# Patient Record
Sex: Male | Born: 1975 | Hispanic: No | Marital: Married | Smoking: Never smoker
Health system: Southern US, Community
[De-identification: ages and names within clinical notes are randomized; demographics above are authoritative.]

## PROBLEM LIST (undated history)

## (undated) DIAGNOSIS — M2170 Unequal limb length (acquired), unspecified site: Secondary | ICD-10-CM

## (undated) DIAGNOSIS — M25559 Pain in unspecified hip: Secondary | ICD-10-CM

## (undated) HISTORY — DX: Unequal limb length (acquired), unspecified site: M21.70

## (undated) HISTORY — DX: Pain in unspecified hip: M25.559

## (undated) HISTORY — PX: LEG SURGERY: SHX1003

---

## 1998-02-09 HISTORY — PX: HIP SURGERY: SHX245

## 2013-09-05 ENCOUNTER — Other Ambulatory Visit: Payer: Self-pay | Admitting: Orthopaedic Surgery

## 2013-09-05 DIAGNOSIS — M25551 Pain in right hip: Secondary | ICD-10-CM

## 2013-09-05 DIAGNOSIS — M25552 Pain in left hip: Secondary | ICD-10-CM

## 2013-09-11 ENCOUNTER — Ambulatory Visit
Admission: RE | Admit: 2013-09-11 | Discharge: 2013-09-11 | Disposition: A | Discharge status: Home / Self Care | Payer: PRIVATE HEALTH INSURANCE | Source: Ambulatory Visit | Attending: Orthopaedic Surgery | Admitting: Orthopaedic Surgery

## 2013-09-11 DIAGNOSIS — M25552 Pain in left hip: Secondary | ICD-10-CM

## 2013-10-05 ENCOUNTER — Telehealth: Payer: Self-pay

## 2013-10-31 DIAGNOSIS — Z9889 Other specified postprocedural states: Secondary | ICD-10-CM | POA: Insufficient documentation

## 2013-10-31 DIAGNOSIS — M25559 Pain in unspecified hip: Secondary | ICD-10-CM | POA: Insufficient documentation

## 2013-11-03 ENCOUNTER — Encounter: Payer: Self-pay | Admitting: Internal Medicine

## 2013-11-03 ENCOUNTER — Ambulatory Visit (INDEPENDENT_AMBULATORY_CARE_PROVIDER_SITE_OTHER): Payer: PRIVATE HEALTH INSURANCE | Admitting: Internal Medicine

## 2013-11-03 VITALS — BP 122/68 | HR 63 | Temp 98.6°F | Ht 63.0 in | Wt 137.1 lb

## 2013-11-03 DIAGNOSIS — Z0000 Encounter for general adult medical examination without abnormal findings: Secondary | ICD-10-CM | POA: Insufficient documentation

## 2013-11-03 DIAGNOSIS — Z23 Encounter for immunization: Secondary | ICD-10-CM

## 2013-11-03 DIAGNOSIS — M25552 Pain in left hip: Secondary | ICD-10-CM

## 2013-11-03 DIAGNOSIS — M25559 Pain in unspecified hip: Secondary | ICD-10-CM | POA: Insufficient documentation

## 2013-11-03 NOTE — Progress Notes (Signed)
Pre visit review using our clinic review tool, if applicable. No additional management support is needed unless otherwise documented below in the visit note. 

## 2013-11-03 NOTE — Patient Instructions (Addendum)
  Stop by the front desk and schedule labs Stage be done within few days (fasting) Orders  are already in the computer    Please come back Woodrow the office in 1 year  for a physical exam. Come back fasting      Get your flu shot next week   Testicular Self-Exam A self-examination of your testicles involves looking at and feeling your testicles for abnormal lumps or swelling. Several things can cause swelling, lumps, or pain in your testicles. Some of these causes are:  Injuries.  Inflammation.  Infection.  Accumulation of fluids around your testicle (hydrocele).  Twisted testicles (testicular torsion).  Testicular cancer. Self-examination of the testicles and groin areas may be advised if you are at risk for testicular cancer. Risks for testicular cancer include:  An undescended testicle (cryptorchidism).  A history of previous testicular cancer.  A family history of testicular cancer. The testicles are easiest Capozzoli examine after warm baths or showers and are more difficult Lawry examine when you are cold. This is because the muscles attached Rochin the testicles retract and pull them up higher or into the abdomen. Follow these steps while you are standing:  Hold your penis away from your body.  Roll one testicle between your thumb and forefinger, feeling the entire testicle.  Roll the other testicle between your thumb and forefinger, feeling the entire testicle. Feel for lumps, swelling, or discomfort. A normal testicle is egg shaped and feels firm. It is smooth and not tender. The spermatic cord can be felt as a firm spaghetti-like cord at the back of your testicle. It is also important Azzarello examine the crease between the front of your leg and your abdomen. Feel for any bumps that are tender. These could be enlarged lymph nodes.  Document Released: 05/04/2000 Document Revised: 09/28/2012 Document Reviewed: 07/18/2012 [facility] Patient Information 2015 [place], [place]. This information is not  intended Gorgas replace advice given Robinette you by your health care provider. Make sure you discuss any questions you have with your health care provider.

## 2013-11-03 NOTE — Assessment & Plan Note (Addendum)
Td-- today rec a Flu shot next week Atypical chest pain, EKG with early repolarization changes. Advice Buick call me if the pain change or increases Diet-exercise discussed Labs including FLP, CBC, CMP, TSH, and screening for hepatitis B, hepatitis C STE
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
## 2013-11-08 ENCOUNTER — Other Ambulatory Visit (INDEPENDENT_AMBULATORY_CARE_PROVIDER_SITE_OTHER): Payer: PRIVATE HEALTH INSURANCE

## 2013-11-08 DIAGNOSIS — Z0000 Encounter for general adult medical examination without abnormal findings: Secondary | ICD-10-CM

## 2013-11-08 LAB — TSH: TSH: 0.65 u[IU]/mL (ref 0.35–4.50)

## 2013-11-08 LAB — CBC WITH DIFFERENTIAL/PLATELET
Basophils Absolute: 0 10*3/uL (ref 0.0–0.1)
Basophils Relative: 0.5 % (ref 0.0–3.0)
EOS ABS: 0.4 10*3/uL (ref 0.0–0.7)
EOS PCT: 7.5 % — AB (ref 0.0–5.0)
HCT: 43.4 % (ref 39.0–52.0)
HEMOGLOBIN: 14.4 g/dL (ref 13.0–17.0)
Lymphocytes Relative: 36.1 % (ref 12.0–46.0)
Lymphs Abs: 1.8 10*3/uL (ref 0.7–4.0)
MCHC: 33.3 g/dL (ref 30.0–36.0)
MCV: 91.9 fl (ref 78.0–100.0)
Monocytes Absolute: 0.3 10*3/uL (ref 0.1–1.0)
Monocytes Relative: 6 % (ref 3.0–12.0)
NEUTROS ABS: 2.4 10*3/uL (ref 1.4–7.7)
Neutrophils Relative %: 49.9 % (ref 43.0–77.0)
Platelets: 273 10*3/uL (ref 150.0–400.0)
RBC: 4.72 Mil/uL (ref 4.22–5.81)
RDW: 13.4 % (ref 11.5–15.5)
WBC: 4.9 10*3/uL (ref 4.0–10.5)

## 2013-11-08 LAB — LIPID PANEL
CHOL/HDL RATIO: 3
Cholesterol: 185 mg/dL (ref 0–200)
HDL: 52.9 mg/dL (ref >39.00)
LDL Cholesterol: 122 mg/dL — ABNORMAL HIGH (ref 0–99)
NonHDL: 132.1
TRIGLYCERIDES: 53 mg/dL (ref 0.0–149.0)
VLDL: 10.6 mg/dL (ref 0.0–40.0)

## 2013-11-08 LAB — COMPREHENSIVE METABOLIC PANEL
ALK PHOS: 56 U/L (ref 39–117)
ALT: 21 U/L (ref 0–53)
AST: 22 U/L (ref 0–37)
Albumin: 4.3 g/dL (ref 3.5–5.2)
BUN: 17 mg/dL (ref 6–23)
CO2: 28 mEq/L (ref 19–32)
CREATININE: 0.9 mg/dL (ref 0.4–1.5)
Calcium: 9.2 mg/dL (ref 8.4–10.5)
Chloride: 104 mEq/L (ref 96–112)
GFR: 97.48 mL/min (ref >60.00)
Glucose, Bld: 83 mg/dL (ref 70–99)
Potassium: 3.8 mEq/L (ref 3.5–5.1)
Sodium: 138 mEq/L (ref 135–145)
Total Bilirubin: 0.6 mg/dL (ref 0.2–1.2)
Total Protein: 7.2 g/dL (ref 6.0–8.3)

## 2013-11-09 LAB — HEPATITIS B SURFACE ANTIGEN: HEP B S AG: NEGATIVE

## 2013-11-09 LAB — HEPATITIS C ANTIBODY: HCV Ab: NEGATIVE

## 2013-11-09 LAB — HEPATITIS B CORE ANTIBODY, TOTAL: Hep B Core Total Ab: NONREACTIVE

## 2013-11-22 ENCOUNTER — Telehealth: Payer: Self-pay | Admitting: Internal Medicine

## 2013-11-22 NOTE — Telephone Encounter (Signed)
Caller name: Roger ShelterDong Relation Southard pt: Call back number: [phone]  Reason for call: Pt ask Jurewicz be called for results for his labs, why blood cell counts are high and Sandall see if needed an appt.

## 2013-11-22 NOTE — Telephone Encounter (Signed)
White cells are essentially normal, eosinophils are slightly elevated which is not uncommon this time of year due Payeur allergies, no further testing is recommended at this time

## 2013-11-22 NOTE — Telephone Encounter (Signed)
Spoke with Pt about white blood cells elevated informed him of Dr. Maurilio LovelyPazs recommendations.

## 2013-11-22 NOTE — Telephone Encounter (Signed)
Please advise 

## 2014-11-05 ENCOUNTER — Encounter: Payer: Self-pay | Admitting: *Deleted

## 2014-11-05 ENCOUNTER — Telehealth: Payer: Self-pay | Admitting: *Deleted

## 2014-11-05 NOTE — Telephone Encounter (Signed)
Pre-Visit Call completed with patient and chart updated.   Pre-Visit Info documented in Specialty Comments under SnapShot.    Patient cancelled appointment.

## 2014-11-06 ENCOUNTER — Encounter: Payer: PRIVATE HEALTH INSURANCE | Admitting: Internal Medicine

## 2015-10-24 ENCOUNTER — Ambulatory Visit: Payer: PRIVATE HEALTH INSURANCE | Admitting: Internal Medicine

## 2015-12-03 ENCOUNTER — Ambulatory Visit (INDEPENDENT_AMBULATORY_CARE_PROVIDER_SITE_OTHER): Payer: 59 | Admitting: Internal Medicine

## 2015-12-03 ENCOUNTER — Encounter: Payer: Self-pay | Admitting: Internal Medicine

## 2015-12-03 VITALS — BP 118/68 | HR 54 | Temp 97.8°F | Resp 14 | Ht 63.0 in | Wt 141.0 lb

## 2015-12-03 DIAGNOSIS — M2550 Pain in unspecified joint: Secondary | ICD-10-CM

## 2015-12-03 DIAGNOSIS — Z0000 Encounter for general adult medical examination without abnormal findings: Secondary | ICD-10-CM

## 2015-12-03 NOTE — Patient Instructions (Signed)
GO Noah Hodges THE LAB : Get the blood work     GO Noah Hodges THE FRONT DESK Schedule your next appointment   for a physical exam in 1 year 

## 2015-12-03 NOTE — Progress Notes (Signed)
Pre visit review using our clinic review tool, if applicable. No additional management support is needed unless otherwise documented below in the visit note. 
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
## 2015-12-03 NOTE — Assessment & Plan Note (Addendum)
Td--2015, had a  Flu shot last week  Diet-exercise discussed Labs : FLP, CBC, CMP, TSH, sedimentation rate, RF, ANA. HIV.

## 2015-12-04 DIAGNOSIS — Z09 Encounter for follow-up examination after completed treatment for conditions other than malignant neoplasm: Secondary | ICD-10-CM | POA: Insufficient documentation

## 2015-12-04 LAB — CBC WITH DIFFERENTIAL/PLATELET
BASOS ABS: 0 10*3/uL (ref 0.0–0.1)
Basophils Relative: 0.4 % (ref 0.0–3.0)
EOS PCT: 3.8 % (ref 0.0–5.0)
Eosinophils Absolute: 0.3 10*3/uL (ref 0.0–0.7)
HCT: 45.1 % (ref 39.0–52.0)
HEMOGLOBIN: 15.2 g/dL (ref 13.0–17.0)
Lymphocytes Relative: 26 % (ref 12.0–46.0)
Lymphs Abs: 1.9 10*3/uL (ref 0.7–4.0)
MCHC: 33.8 g/dL (ref 30.0–36.0)
MCV: 92.4 fl (ref 78.0–100.0)
MONO ABS: 0.4 10*3/uL (ref 0.1–1.0)
MONOS PCT: 5.9 % (ref 3.0–12.0)
NEUTROS PCT: 63.9 % (ref 43.0–77.0)
Neutro Abs: 4.7 10*3/uL (ref 1.4–7.7)
Platelets: 295 10*3/uL (ref 150.0–400.0)
RBC: 4.88 Mil/uL (ref 4.22–5.81)
RDW: 13 % (ref 11.5–15.5)
WBC: 7.4 10*3/uL (ref 4.0–10.5)

## 2015-12-04 LAB — COMPREHENSIVE METABOLIC PANEL
ALK PHOS: 67 U/L (ref 39–117)
ALT: 23 U/L (ref 0–53)
AST: 24 U/L (ref 0–37)
Albumin: 4.8 g/dL (ref 3.5–5.2)
BILIRUBIN TOTAL: 0.3 mg/dL (ref 0.2–1.2)
BUN: 15 mg/dL (ref 6–23)
CO2: 27 mEq/L (ref 19–32)
Calcium: 10 mg/dL (ref 8.4–10.5)
Chloride: 104 mEq/L (ref 96–112)
Creatinine, Ser: 0.82 mg/dL (ref 0.40–1.50)
GFR: 111.47 mL/min (ref >60.00)
Glucose, Bld: 91 mg/dL (ref 70–99)
Potassium: 4.1 mEq/L (ref 3.5–5.1)
Sodium: 140 mEq/L (ref 135–145)
TOTAL PROTEIN: 7.6 g/dL (ref 6.0–8.3)

## 2015-12-04 LAB — HIV ANTIBODY (ROUTINE TESTING W REFLEX): HIV: NONREACTIVE

## 2015-12-04 LAB — LIPID PANEL
Cholesterol: 203 mg/dL — ABNORMAL HIGH (ref 0–200)
HDL: 64.3 mg/dL (ref >39.00)
LDL Cholesterol: 122 mg/dL — ABNORMAL HIGH (ref 0–99)
NONHDL: 138.78
Total CHOL/HDL Ratio: 3
Triglycerides: 86 mg/dL (ref 0.0–149.0)
VLDL: 17.2 mg/dL (ref 0.0–40.0)

## 2015-12-04 LAB — RHEUMATOID FACTOR

## 2015-12-04 LAB — TSH: TSH: 1.28 u[IU]/mL (ref 0.35–4.50)

## 2015-12-04 LAB — ANA: ANA: NEGATIVE

## 2015-12-04 LAB — SEDIMENTATION RATE: Sed Rate: 7 mm/hr (ref 0–15)

## 2015-12-04 NOTE — Assessment & Plan Note (Signed)
Leg length discrepancy: Offered a referral [facility], due Bacchi the discrepancy he may develop early lower extremity arthritis, likely will benefit from a shoe insert. Declined referral  Hand arthralgias: No synovitis on exam, early OA? Will get a sedimentation rate, ANA and rheumatoid factor Tongue  irritation? Exam is completely normal, recommend Ramberg see a dentist or call if symptoms persist RTC one year

## 2017-01-26 ENCOUNTER — Encounter: Payer: Self-pay | Admitting: Internal Medicine

## 2017-01-26 ENCOUNTER — Ambulatory Visit (INDEPENDENT_AMBULATORY_CARE_PROVIDER_SITE_OTHER): Payer: 59 | Admitting: Internal Medicine

## 2017-01-26 VITALS — BP 124/68 | HR 58 | Temp 98.3°F | Resp 14 | Ht 63.0 in | Wt 136.2 lb

## 2017-01-26 DIAGNOSIS — Z0000 Encounter for general adult medical examination without abnormal findings: Secondary | ICD-10-CM

## 2017-01-26 NOTE — Progress Notes (Signed)
Pre visit review using our clinic review tool, if applicable. No additional management support is needed unless otherwise documented below in the visit note. 

## 2017-01-26 NOTE — Assessment & Plan Note (Addendum)
.-  Td 2015, had a  Flu shot  -Previous labs are reviewed, all very good, recheck a FLP. -Diet-exercise discussed
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
## 2017-01-26 NOTE — Patient Instructions (Signed)
GO Piper THE LAB : Get the blood work     GO Watanabe THE FRONT DESK Schedule your next appointment   for a physical exam in 1 year 

## 2017-01-27 LAB — LIPID PANEL
CHOLESTEROL: 171 mg/dL (ref 0–200)
HDL: 58.1 mg/dL (ref >39.00)
LDL Cholesterol: 99 mg/dL (ref 0–99)
NONHDL: 113
TRIGLYCERIDES: 68 mg/dL (ref 0.0–149.0)
Total CHOL/HDL Ratio: 3
VLDL: 13.6 mg/dL (ref 0.0–40.0)

## 2017-01-27 NOTE — Assessment & Plan Note (Signed)
Here for CPX.  Doing well Complaint of "headache".  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  He thinks is stress/tension related.  Recommend good neck stretching before going Schoenberg work, good ergonomic posture, call if not improving.  Okay Prater take occasional Tylenol or ibuprofen.  Also encourage good stress management, yoga? Meditation?  Grace call if he has severe or unusual symptoms. RTC 1 year

## 2017-07-06 ENCOUNTER — Encounter: Payer: Self-pay | Admitting: Internal Medicine

## 2017-07-06 ENCOUNTER — Ambulatory Visit (INDEPENDENT_AMBULATORY_CARE_PROVIDER_SITE_OTHER): Payer: 59 | Admitting: Internal Medicine

## 2017-07-06 VITALS — BP 116/74 | HR 63 | Temp 98.3°F | Resp 14 | Ht 63.0 in | Wt 143.4 lb

## 2017-07-06 DIAGNOSIS — Z0000 Encounter for general adult medical examination without abnormal findings: Secondary | ICD-10-CM | POA: Diagnosis not present

## 2017-07-06 NOTE — Assessment & Plan Note (Addendum)
.-  Td 2015 -Labs: CMP, CBC, TSH -Diet-exercise discussed
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
## 2017-07-06 NOTE — Patient Instructions (Signed)
GO Cerrato THE LAB : Get the blood work     GO Tailor THE FRONT DESK Schedule your next appointment   for a physical exam in 1 year 

## 2017-07-06 NOTE — Progress Notes (Signed)
Pre visit review using our clinic review tool, if applicable. No additional management support is needed unless otherwise documented below in the visit note. 

## 2017-07-07 LAB — COMPREHENSIVE METABOLIC PANEL
ALBUMIN: 4.4 g/dL (ref 3.5–5.2)
ALK PHOS: 53 U/L (ref 39–117)
ALT: 12 U/L (ref 0–53)
AST: 15 U/L (ref 0–37)
BILIRUBIN TOTAL: 0.4 mg/dL (ref 0.2–1.2)
BUN: 15 mg/dL (ref 6–23)
CO2: 27 meq/L (ref 19–32)
CREATININE: 1.01 mg/dL (ref 0.40–1.50)
Calcium: 9.3 mg/dL (ref 8.4–10.5)
Chloride: 104 mEq/L (ref 96–112)
GFR: 86.92 mL/min (ref >60.00)
Glucose, Bld: 104 mg/dL — ABNORMAL HIGH (ref 70–99)
Potassium: 4.3 mEq/L (ref 3.5–5.1)
Sodium: 139 mEq/L (ref 135–145)
TOTAL PROTEIN: 6.9 g/dL (ref 6.0–8.3)

## 2017-07-07 LAB — CBC WITH DIFFERENTIAL/PLATELET
BASOS ABS: 0.1 10*3/uL (ref 0.0–0.1)
Basophils Relative: 1 % (ref 0.0–3.0)
EOS ABS: 0.3 10*3/uL (ref 0.0–0.7)
Eosinophils Relative: 5.6 % — ABNORMAL HIGH (ref 0.0–5.0)
HCT: 43 % (ref 39.0–52.0)
Hemoglobin: 14.2 g/dL (ref 13.0–17.0)
LYMPHS PCT: 29.7 % (ref 12.0–46.0)
Lymphs Abs: 1.7 10*3/uL (ref 0.7–4.0)
MCHC: 33 g/dL (ref 30.0–36.0)
MCV: 95.4 fl (ref 78.0–100.0)
Monocytes Absolute: 0.5 10*3/uL (ref 0.1–1.0)
Monocytes Relative: 8.1 % (ref 3.0–12.0)
NEUTROS ABS: 3.2 10*3/uL (ref 1.4–7.7)
NEUTROS PCT: 55.6 % (ref 43.0–77.0)
PLATELETS: 283 10*3/uL (ref 150.0–400.0)
RBC: 4.51 Mil/uL (ref 4.22–5.81)
RDW: 12.8 % (ref 11.5–15.5)
WBC: 5.7 10*3/uL (ref 4.0–10.5)

## 2017-07-07 LAB — TSH: TSH: 0.98 u[IU]/mL (ref 0.35–4.50)

## 2017-07-07 NOTE — Assessment & Plan Note (Signed)
Here for CPX.  Doing well HA: See last visit, resolved Leg length discrepancy: He is having some issues with decrease flexibility of the hip, not interested in further intervention but at some point he might benefit from seeing physical and rehabilitation medicine. RTC 1 year

## 2018-09-14 ENCOUNTER — Encounter: Payer: Self-pay | Admitting: Internal Medicine

## 2019-06-15 ENCOUNTER — Ambulatory Visit (HOSPITAL_BASED_OUTPATIENT_CLINIC_OR_DEPARTMENT_OTHER)
Admission: RE | Admit: 2019-06-15 | Discharge: 2019-06-15 | Disposition: A | Discharge status: Home / Self Care | Payer: BC Managed Care – PPO | Source: Ambulatory Visit | Attending: Internal Medicine | Admitting: Internal Medicine

## 2019-06-15 ENCOUNTER — Ambulatory Visit (INDEPENDENT_AMBULATORY_CARE_PROVIDER_SITE_OTHER): Payer: BC Managed Care – PPO | Admitting: Internal Medicine

## 2019-06-15 ENCOUNTER — Encounter: Payer: Self-pay | Admitting: Internal Medicine

## 2019-06-15 ENCOUNTER — Other Ambulatory Visit: Payer: Self-pay

## 2019-06-15 VITALS — BP 127/78 | HR 62 | Temp 97.5°F | Resp 16 | Ht 63.0 in | Wt 139.0 lb

## 2019-06-15 DIAGNOSIS — Z0000 Encounter for general adult medical examination without abnormal findings: Secondary | ICD-10-CM

## 2019-06-15 DIAGNOSIS — R079 Chest pain, unspecified: Secondary | ICD-10-CM | POA: Diagnosis not present

## 2019-06-15 LAB — LIPID PANEL
Cholesterol: 172 mg/dL (ref 0–200)
HDL: 53.7 mg/dL (ref >39.00)
LDL Cholesterol: 99 mg/dL (ref 0–99)
NonHDL: 118.49
Total CHOL/HDL Ratio: 3
Triglycerides: 95 mg/dL (ref 0.0–149.0)
VLDL: 19 mg/dL (ref 0.0–40.0)

## 2019-06-15 LAB — COMPREHENSIVE METABOLIC PANEL
ALT: 12 U/L (ref 0–53)
AST: 17 U/L (ref 0–37)
Albumin: 4.4 g/dL (ref 3.5–5.2)
Alkaline Phosphatase: 61 U/L (ref 39–117)
BUN: 17 mg/dL (ref 6–23)
CO2: 29 mEq/L (ref 19–32)
Calcium: 9 mg/dL (ref 8.4–10.5)
Chloride: 104 mEq/L (ref 96–112)
Creatinine, Ser: 0.79 mg/dL (ref 0.40–1.50)
GFR: 107.55 mL/min (ref >60.00)
Glucose, Bld: 85 mg/dL (ref 70–99)
Potassium: 4.1 mEq/L (ref 3.5–5.1)
Sodium: 137 mEq/L (ref 135–145)
Total Bilirubin: 0.4 mg/dL (ref 0.2–1.2)
Total Protein: 6.8 g/dL (ref 6.0–8.3)

## 2019-06-15 LAB — CBC WITH DIFFERENTIAL/PLATELET
Basophils Absolute: 0.1 10*3/uL (ref 0.0–0.1)
Basophils Relative: 1.1 % (ref 0.0–3.0)
Eosinophils Absolute: 0.4 10*3/uL (ref 0.0–0.7)
Eosinophils Relative: 6.9 % — ABNORMAL HIGH (ref 0.0–5.0)
HCT: 44.3 % (ref 39.0–52.0)
Hemoglobin: 14.8 g/dL (ref 13.0–17.0)
Lymphocytes Relative: 33.1 % (ref 12.0–46.0)
Lymphs Abs: 1.7 10*3/uL (ref 0.7–4.0)
MCHC: 33.4 g/dL (ref 30.0–36.0)
MCV: 96.7 fl (ref 78.0–100.0)
Monocytes Absolute: 0.5 10*3/uL (ref 0.1–1.0)
Monocytes Relative: 8.7 % (ref 3.0–12.0)
Neutro Abs: 2.6 10*3/uL (ref 1.4–7.7)
Neutrophils Relative %: 50.2 % (ref 43.0–77.0)
Platelets: 266 10*3/uL (ref 150.0–400.0)
RBC: 4.57 Mil/uL (ref 4.22–5.81)
RDW: 13.3 % (ref 11.5–15.5)
WBC: 5.2 10*3/uL (ref 4.0–10.5)

## 2019-06-15 LAB — TSH: TSH: 1.43 u[IU]/mL (ref 0.35–4.50)

## 2019-06-15 NOTE — Assessment & Plan Note (Addendum)
-  Td 2018 -Labs: CMP, CBC, FLP, TSH -Diet-exercise discussed, he is doing well. -Encouraged Kutsch get the Covid vaccination.

## 2019-06-15 NOTE — Progress Notes (Signed)
Pre visit review using our clinic review tool, if applicable. No additional management support is needed unless otherwise documented below in the visit note. 

## 2019-06-15 NOTE — Patient Instructions (Addendum)
COVID-19 Vaccine Information can be found at: [email or web address] For questions related [place] vaccine distribution or appointments, please email [email or web address] or call [phone].      GO Seabury THE LAB : Get the blood work     GO Gosse THE FRONT DESK, PLEASE SCHEDULE YOUR APPOINTMENTS Come back for a physical exam in 1 year  STOP BY THE FIRST FLOOR:  get the XR
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
## 2019-06-16 NOTE — Assessment & Plan Note (Signed)
Here for CPX Chest pain: As described above, quite atypical, unlikely Sammon be CAD related, for completeness we will get a chest x-ray and recommend observation. Dizziness: Also, at the end of the visit he mentioned that sometimes when he move his head fast get dizzy for a few seconds.  Rec  observation RTC 1 year

## 2020-05-07 ENCOUNTER — Encounter: Payer: Self-pay | Admitting: Internal Medicine

## 2020-06-19 ENCOUNTER — Encounter: Payer: BC Managed Care – PPO | Admitting: Internal Medicine

## 2020-10-03 ENCOUNTER — Encounter: Payer: Self-pay | Admitting: Internal Medicine

## 2020-10-03 ENCOUNTER — Ambulatory Visit (INDEPENDENT_AMBULATORY_CARE_PROVIDER_SITE_OTHER): Payer: BC Managed Care – PPO | Admitting: Internal Medicine

## 2020-10-03 ENCOUNTER — Other Ambulatory Visit: Payer: Self-pay

## 2020-10-03 ENCOUNTER — Ambulatory Visit (INDEPENDENT_AMBULATORY_CARE_PROVIDER_SITE_OTHER): Payer: BC Managed Care – PPO

## 2020-10-03 VITALS — BP 112/72 | HR 64 | Temp 98.2°F | Ht 63.0 in | Wt 138.0 lb

## 2020-10-03 DIAGNOSIS — R0789 Other chest pain: Secondary | ICD-10-CM | POA: Diagnosis not present

## 2020-10-03 DIAGNOSIS — Z0000 Encounter for general adult medical examination without abnormal findings: Secondary | ICD-10-CM | POA: Diagnosis not present

## 2020-10-03 LAB — CBC WITH DIFFERENTIAL/PLATELET
Basophils Absolute: 0 10*3/uL (ref 0.0–0.1)
Basophils Relative: 0.8 % (ref 0.0–3.0)
Eosinophils Absolute: 0.2 10*3/uL (ref 0.0–0.7)
Eosinophils Relative: 4 % (ref 0.0–5.0)
HCT: 43.1 % (ref 39.0–52.0)
Hemoglobin: 14.4 g/dL (ref 13.0–17.0)
Lymphocytes Relative: 33.9 % (ref 12.0–46.0)
Lymphs Abs: 1.9 10*3/uL (ref 0.7–4.0)
MCHC: 33.4 g/dL (ref 30.0–36.0)
MCV: 95 fl (ref 78.0–100.0)
Monocytes Absolute: 0.4 10*3/uL (ref 0.1–1.0)
Monocytes Relative: 7.7 % (ref 3.0–12.0)
Neutro Abs: 3 10*3/uL (ref 1.4–7.7)
Neutrophils Relative %: 53.6 % (ref 43.0–77.0)
Platelets: 258 10*3/uL (ref 150.0–400.0)
RBC: 4.53 Mil/uL (ref 4.22–5.81)
RDW: 13.5 % (ref 11.5–15.5)
WBC: 5.6 10*3/uL (ref 4.0–10.5)

## 2020-10-03 LAB — HEPATIC FUNCTION PANEL
ALT: 13 U/L (ref 0–53)
AST: 18 U/L (ref 0–37)
Albumin: 4.3 g/dL (ref 3.5–5.2)
Alkaline Phosphatase: 47 U/L (ref 39–117)
Bilirubin, Direct: 0.2 mg/dL (ref 0.0–0.3)
Total Bilirubin: 0.8 mg/dL (ref 0.2–1.2)
Total Protein: 7 g/dL (ref 6.0–8.3)

## 2020-10-03 LAB — BASIC METABOLIC PANEL
BUN: 17 mg/dL (ref 6–23)
CO2: 27 mEq/L (ref 19–32)
Calcium: 9.5 mg/dL (ref 8.4–10.5)
Chloride: 105 mEq/L (ref 96–112)
Creatinine, Ser: 0.9 mg/dL (ref 0.40–1.50)
GFR: 104.74 mL/min (ref >60.00)
Glucose, Bld: 80 mg/dL (ref 70–99)
Potassium: 3.9 mEq/L (ref 3.5–5.1)
Sodium: 140 mEq/L (ref 135–145)

## 2020-10-03 LAB — LIPID PANEL
Cholesterol: 178 mg/dL (ref 0–200)
HDL: 60.7 mg/dL (ref >39.00)
LDL Cholesterol: 104 mg/dL — ABNORMAL HIGH (ref 0–99)
NonHDL: 117.35
Total CHOL/HDL Ratio: 3
Triglycerides: 66 mg/dL (ref 0.0–149.0)
VLDL: 13.2 mg/dL (ref 0.0–40.0)

## 2020-10-03 LAB — D-DIMER, QUANTITATIVE: D-Dimer, Quant: <0.19 mcg/mL FEU (ref <0.50)

## 2020-10-03 NOTE — Progress Notes (Signed)
Subjective:  Patient ID: Noah Hodges, male    DOB: 1975/08/03  Age: 45 y.o. MRN: [id number]  CC: Annual Exam and Chest Pain  This visit occurred during the SARS-CoV-2 public health emergency.  Safety protocols were in place, including screening questions prior Noah Hodges the visit, additional usage of staff PPE, and extensive cleaning of exam room while observing appropriate contact time as indicated for disinfecting solutions.    HPI Noah Hodges presents for a CPX and Noah Hodges establish.  Noah Hodges complains of a 1 week history of substernal chest pain.  Noah Hodges describes it as a stabbing and throbbing sensation that is not worsened by activity, deep breath, or movement.  Noah Hodges says the same thing happened 5 years ago and it resolved spontaneously.  No outpatient medications prior Noah Hodges visit.   No facility-administered medications prior Noah Hodges visit.    ROS Review of Systems  Constitutional:  Negative for chills, diaphoresis, fatigue and fever.  HENT: Negative.  Negative for sore throat, trouble swallowing and voice change.   Respiratory:  Negative for cough, chest tightness, shortness of breath and wheezing.   Cardiovascular:  Positive for chest pain. Negative for palpitations and leg swelling.  Gastrointestinal:  Negative for abdominal pain, constipation, diarrhea, nausea and vomiting.  Genitourinary: Negative.  Negative for difficulty urinating, scrotal swelling and testicular pain.  Musculoskeletal: Negative.  Negative for arthralgias and myalgias.  Skin: Negative.  Negative for color change and pallor.  Neurological: Negative.  Negative for dizziness, weakness, light-headedness and headaches.  Hematological:  Negative for adenopathy. Does not bruise/bleed easily.  Psychiatric/Behavioral: Negative.     Objective:  BP 112/72 (BP Location: Left Arm, Patient Position: Sitting, Cuff Size: Large)   Pulse 64   Temp 98.2 F (36.8 C) (Oral)   Ht 5\' 3"  (1.6 m)   Wt 138 lb (62.6 kg)   SpO2 98%   BMI 24.45 kg/m   BP  Readings from Last 3 Encounters:  10/03/20 112/72  06/15/19 127/78  07/06/17 116/74    Wt Readings from Last 3 Encounters:  10/03/20 138 lb (62.6 kg)  06/15/19 139 lb (63 kg)  07/06/17 143 lb 6 oz (65 kg)    Physical Exam Vitals reviewed.  HENT:     Nose: Nose normal.     Mouth/Throat:     Mouth: Mucous membranes are moist.  Eyes:     General: No scleral icterus.    Conjunctiva/sclera: Conjunctivae normal.  Cardiovascular:     Rate and Rhythm: Regular rhythm. Bradycardia present.     Heart sounds: Normal heart sounds, S1 normal and S2 normal. No murmur heard.   No gallop.     Comments: EKG- Sinus bradycardia, 59 bpm Early repol No Q waves Pulmonary:     Effort: Pulmonary effort is normal.     Breath sounds: No stridor. No wheezing, rhonchi or rales.  Chest:     Chest wall: No mass, swelling, tenderness, crepitus or edema.  Abdominal:     General: Abdomen is flat.     Palpations: There is no mass.     Tenderness: There is no abdominal tenderness. There is no guarding.     Hernia: No hernia is present.  Musculoskeletal:        General: Normal range of motion.     Cervical back: Neck supple.     Right lower leg: No edema.     Left lower leg: No edema.  Lymphadenopathy:     Cervical: No cervical adenopathy.  Right cervical: No superficial cervical adenopathy.    Left cervical: No superficial cervical adenopathy.     Upper Body:     Right upper body: No supraclavicular or axillary adenopathy.     Left upper body: No supraclavicular or axillary adenopathy.  Skin:    General: Skin is warm and dry.     Findings: No rash.  Neurological:     General: No focal deficit present.     Mental Status: Noah Hodges is alert.  Psychiatric:        Mood and Affect: Mood normal.        Behavior: Behavior normal.    Lab Results  Component Value Date   WBC 5.6 10/03/2020   HGB 14.4 10/03/2020   HCT 43.1 10/03/2020   PLT 258.0 10/03/2020   GLUCOSE 80 10/03/2020   CHOL 178  10/03/2020   TRIG 66.0 10/03/2020   HDL 60.70 10/03/2020   LDLCALC 104 (H) 10/03/2020   ALT 13 10/03/2020   AST 18 10/03/2020   NA 140 10/03/2020   K 3.9 10/03/2020   CL 105 10/03/2020   CREATININE 0.90 10/03/2020   BUN 17 10/03/2020   CO2 27 10/03/2020   TSH 1.43 06/15/2019    DG Chest 2 View  Result Date: 06/15/2019 CLINICAL DATA:  Chest pain. EXAM: CHEST - 2 VIEW COMPARISON:  No prior. FINDINGS: Mediastinum and hilar structures normal. Lungs are clear. No pleural effusion or pneumothorax. No acute bony abnormality identified. IMPRESSION: No acute cardiopulmonary disease. Electronically Signed   By: Maisie Fus  Register   On: 06/15/2019 14:54   DG Chest 2 View  Result Date: 10/03/2020 CLINICAL DATA:  Chest pain. EXAM: CHEST - 2 VIEW COMPARISON:  Jun 15, 2019. FINDINGS: The heart size and mediastinal contours are within normal limits. Both lungs are clear. The visualized skeletal structures are unremarkable. IMPRESSION: No active cardiopulmonary disease. Electronically Signed   By: Lupita Raider M.D.   On: 10/03/2020 16:43      Assessment & Plan:   Noah Hodges was seen today for annual exam and chest pain.  Diagnoses and all orders for this visit:  Annual physical exam- Exam completed, labs reviewed-statin therapy is not indicated, no cancer screenings indicated, vaccines are up-Brinson-date, patient education was given. -     Lipid panel; Future -     Lipid panel  Atypical chest pain- Based on a paucity of other symptoms, the examination, the lab work, the EKG, and the chest x-ray this is musculoskeletal pain and I have offered him reassurance.  Noah Hodges will let me know if Noah Hodges develops any new or worsening symptoms. -     Basic metabolic panel; Future -     Hepatic function panel; Future -     D-dimer, quantitative; Future -     DG Chest 2 View; Future -     CBC with Differential/Platelet; Future -     EKG 12-Lead -     CBC with Differential/Platelet -     Hepatic function panel -     D-dimer,  quantitative -     Basic metabolic panel  Drew N. Benak does not currently have medications on file.  No orders of the defined types were placed in this encounter.    Follow-up: Return in about 3 months (around 01/03/2021).  Sanda Linger, MD

## 2020-10-03 NOTE — Patient Instructions (Signed)
Nonspecific Chest Pain, Adult Chest pain can be caused by many different conditions. It can be caused by a condition that is life-threatening and requires treatment right away. It can also be caused by something that is not life-threatening. If you have chest pain, it can be hard Pepin know the difference, so it is important Singleton get helpright away Stohr make sure that you do not have a serious condition. Some life-threatening causes of chest pain include: Heart attack. A tear in the body's main blood vessel (aortic dissection). Inflammation around your heart (pericarditis). A problem in the lungs, such as a blood clot (pulmonary embolism) or a collapsed lung (pneumothorax). Some non life-threatening causes of chest pain include: Heartburn. Anxiety or stress. Damage Sweetin the bones, muscles, and cartilage that make up your chest wall. Pneumonia or bronchitis. Shingles infection (varicella-zoster virus). Chest pain can feel like: Pain or discomfort on the surface of your chest or deep in your chest. Crushing, pressure, aching, or squeezing pain. Burning or tingling. Dull or sharp pain that is worse when you move, cough, or take a deep breath. Pain or discomfort that is also felt in your back, neck, jaw, shoulder, or arm, or pain that spreads Speights any of these areas. Your chest pain may come and go. It may also be constant. Your health care provider will do lab tests and other studies Stroupe find the cause of your pain.Treatment will depend on the cause of your chest pain. Follow these instructions at home: Medicines Take over-the-counter and prescription medicines only as told by your health care provider. If you were prescribed an antibiotic, take it as told by your health care provider. Do not stop taking the antibiotic even if you start Kawashima feel better. Lifestyle  Rest as directed by your health care provider. Do not use any products that contain nicotine or tobacco, such as cigarettes and e-cigarettes.  If you need help quitting, ask your health care provider. Do not drink alcohol. Make healthy lifestyle choices as recommended. These may include: Getting regular exercise. Ask your health care provider Yohn suggest some activities that are safe for you. Eating a heart-healthy diet. This includes plenty of fresh fruits and vegetables, whole grains, low-fat (lean) protein, and low-fat dairy products. A dietitian can help you find healthy eating options. Maintaining a healthy weight. Managing any other health conditions you have, such as high blood pressure (hypertension) or diabetes. Reducing stress, such as with yoga or relaxation techniques.  General instructions Pay attention Fessenden any changes in your symptoms. Tell your health care provider about them or any new symptoms. Avoid any activities that cause chest pain. Keep all follow-up visits as told by your health care provider. This is important. This includes visits for any further testing if your chest pain does not go away. Contact a health care provider if: Your chest pain does not go away. You feel depressed. You have a fever. Get help right away if: Your chest pain gets worse. You have a cough that gets worse, or you cough up blood. You have severe pain in your abdomen. You faint. You have sudden, unexplained chest discomfort. You have sudden, unexplained discomfort in your arms, back, neck, or jaw. You have shortness of breath at any time. You suddenly start Finks sweat, or your skin gets clammy. You feel nausea or you vomit. You suddenly feel lightheaded or dizzy. You have severe weakness, or unexplained weakness or fatigue. Your heart begins Treu beat quickly, or it feels like it is   skipping beats. These symptoms may represent a serious problem that is an emergency. Do not wait Poliquin see if the symptoms will go away. Get medical help right away. Call your local emergency services (911 in the [place] Do not drive yourself Kaucher the  hospital. Summary Chest pain can be caused by a condition that is serious and requires urgent treatment. It may also be caused by something that is not life-threatening. If you have chest pain, it is very important Kitch see your health care provider. Your health care provider may do lab tests and other studies Huxford find the cause of your pain. Follow your health care provider's instructions on taking medicines, making lifestyle changes, and getting emergency treatment if symptoms become worse. Keep all follow-up visits as told by your health care provider. This includes visits for any further testing if your chest pain does not go away. This information is not intended Detter replace advice given Weisinger you by your health care provider. Make sure you discuss any questions you have with your healthcare provider. Document Revised: 07/29/2017 Document Reviewed: 07/29/2017 Elsevier Patient Education  2022 Elsevier Inc.  

## 2020-10-04 ENCOUNTER — Encounter: Payer: Self-pay | Admitting: Internal Medicine

## 2021-12-02 ENCOUNTER — Ambulatory Visit (INDEPENDENT_AMBULATORY_CARE_PROVIDER_SITE_OTHER): Payer: 59 | Admitting: Internal Medicine

## 2021-12-02 ENCOUNTER — Encounter: Payer: Self-pay | Admitting: Internal Medicine

## 2021-12-02 VITALS — BP 128/88 | HR 65 | Temp 98.1°F | Ht 63.0 in | Wt 141.0 lb

## 2021-12-02 DIAGNOSIS — Z23 Encounter for immunization: Secondary | ICD-10-CM

## 2021-12-02 DIAGNOSIS — Z0001 Encounter for general adult medical examination with abnormal findings: Secondary | ICD-10-CM

## 2021-12-02 DIAGNOSIS — L2084 Intrinsic (allergic) eczema: Secondary | ICD-10-CM

## 2021-12-02 MED ORDER — TRIAMCINOLONE ACETONIDE 0.5 % EX CREA: 1.0000 | TOPICAL_CREAM | Freq: Three times a day (TID) | CUTANEOUS | 2 refills | Status: AC

## 2021-12-02 NOTE — Progress Notes (Signed)
Subjective:  Patient ID: Noah Hodges, male    DOB: Mar 02, 1975  Age: 46 y.o. MRN: [id number]  CC: Annual Exam and Rash   HPI Noah Hodges presents for a CPX and f/up -  He complains of a 2 year hx of itchy area on the penile shaft. It has improved some with OTC "anti-itch" cream.  History Noah Hodges has a past medical history of Hip pain and Leg length discrepancy.   He has a past surgical history that includes Hip surgery (Left, 2000) and Leg Surgery (Left).   His family history includes Diabetes in his mother; Hyperlipidemia in his mother; Hypertension in his mother.He reports that he has never smoked. He has never used smokeless tobacco. He reports current alcohol use of about 5.0 standard drinks of alcohol per week. He reports that he does not use drugs.  No outpatient medications prior Noah Hodges visit.   No facility-administered medications prior Noah Hodges visit.    ROS Review of Systems  Constitutional: Negative.   HENT: Negative.    Eyes: Negative.   Respiratory:  Negative for chest tightness, shortness of breath and wheezing.   Cardiovascular:  Negative for chest pain, palpitations and leg swelling.  Gastrointestinal:  Negative for abdominal pain, diarrhea and nausea.  Endocrine: Negative.   Genitourinary: Negative.  Negative for difficulty urinating, genital sores, penile discharge, scrotal swelling and testicular pain.  Musculoskeletal: Negative.   Skin: Negative.   Neurological: Negative.  Negative for dizziness, weakness and headaches.  Hematological:  Negative for adenopathy. Does not bruise/bleed easily.  Psychiatric/Behavioral: Negative.      Objective:  BP 128/88 (BP Location: Left Arm, Patient Position: Sitting, Cuff Size: Normal)   Pulse 65   Temp 98.1 F (36.7 C) (Oral)   Ht 5\' 3"  (1.6 m)   Wt 141 lb (64 kg)   SpO2 97%   BMI 24.98 kg/m   Physical Exam Vitals reviewed.  HENT:     Mouth/Throat:     Mouth: Mucous membranes are moist.  Eyes:     General: No scleral  icterus.    Conjunctiva/sclera: Conjunctivae normal.  Cardiovascular:     Rate and Rhythm: Normal rate and regular rhythm.     Heart sounds: No murmur heard. Pulmonary:     Effort: Pulmonary effort is normal.     Breath sounds: No stridor. No wheezing, rhonchi or rales.  Abdominal:     General: Abdomen is flat.     Palpations: There is no mass.     Tenderness: There is no abdominal tenderness. There is no guarding.     Hernia: No hernia is present.  Genitourinary:    Pubic Area: Rash present.     Penis: Uncircumcised.      Testes: Normal.    Musculoskeletal:        General: Normal range of motion.     Cervical back: Neck supple.     Right lower leg: No edema.     Left lower leg: No edema.  Lymphadenopathy:     Cervical: No cervical adenopathy.     Lower Body: No right inguinal adenopathy. No left inguinal adenopathy.  Skin:    Findings: Rash present. No abrasion or erythema. Rash is not crusting, macular, papular, pustular, urticarial or vesicular.  Neurological:     General: No focal deficit present.     Mental Status: He is alert.     Lab Results  Component Value Date   WBC 5.6 10/03/2020   HGB 14.4 10/03/2020  HCT 43.1 10/03/2020   PLT 258.0 10/03/2020   GLUCOSE 80 10/03/2020   CHOL 178 10/03/2020   TRIG 66.0 10/03/2020   HDL 60.70 10/03/2020   LDLCALC 104 (H) 10/03/2020   ALT 13 10/03/2020   AST 18 10/03/2020   NA 140 10/03/2020   K 3.9 10/03/2020   CL 105 10/03/2020   CREATININE 0.90 10/03/2020   BUN 17 10/03/2020   CO2 27 10/03/2020   TSH 1.43 06/15/2019     Assessment & Plan:   Noah Hodges was seen today for annual exam and rash.  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 -     triamcinolone cream (KENALOG) 0.5 %; Apply 1 Application topically 3 (three) times daily.  Encounter for general adult medical examination with abnormal findings- Exam completed, no labs indicated, no cancer screenings  indicated, vaccines are up-Bouton-date, patient education was given.  Other orders -     Flu Vaccine QUAD 6+ mos PF IM (Fluarix Quad PF)   I am having Noah Hodges start on triamcinolone cream.  Meds ordered this encounter  Medications   triamcinolone cream (KENALOG) 0.5 %    Sig: Apply 1 Application topically 3 (three) times daily.    Dispense:  30 g    Refill:  2     Follow-up: Return in about 6 months (around 06/03/2022).  Scarlette Calico, MD

## 2021-12-02 NOTE — Patient Instructions (Signed)
Atopic Dermatitis ?Atopic dermatitis is a skin disorder that causes inflammation of the skin. It is marked by a red rash and itchy, dry, scaly skin. It is the most common type of eczema. Eczema is a group of skin conditions that cause the skin Elsasser become rough and swollen. This condition is generally worse during the cooler winter months and often improves during the warm summer months. ?Atopic dermatitis usually starts showing signs in infancy and can last through adulthood. This condition cannot be passed from one person Scheiber another (is not contagious). Atopic dermatitis may not always be present, but when it is, it is called a flare-up. ?What are the causes? ?The exact cause of this condition is not known. Flare-ups may be triggered by: ?Coming in contact with something that you are sensitive or allergic Najarro (allergen). ?Stress. ?Certain foods. ?Extremely hot or cold weather. ?Harsh chemicals and soaps. ?Dry air. ?Chlorine. ?What increases the risk? ?This condition is more likely Dunkley develop in people who have a personal or family history of: ?Eczema. ?Allergies. ?Asthma. ?Hay fever. ?What are the signs or symptoms? ?Symptoms of this condition include: ?Dry, scaly skin. ?Red, itchy rash. ?Itchiness, which can be severe. This may occur before the skin rash. This can make sleeping difficult. ?Skin thickening and cracking that can occur over time. ?How is this diagnosed? ?This condition is diagnosed based on: ?Your symptoms. ?Your medical history. ?A physical exam. ?How is this treated? ?There is no cure for this condition, but symptoms can usually be controlled. Treatment focuses on: ?Controlling the itchiness and scratching. You may be given medicines, such as antihistamines or steroid creams. ?Limiting exposure Vos allergens. ?Recognizing situations that cause stress and developing a plan Paras manage stress. ?If your atopic dermatitis does not get better with medicines, or if it is all over your body (widespread), a  treatment using a specific type of light (phototherapy) may be used. ?Follow these instructions at home: ?Skin care ? ?Keep your skin well moisturized. Doing this seals in moisture and helps Picklesimer prevent dryness. ?Use unscented lotions that have petroleum in them. ?Avoid lotions that contain alcohol or water. They can dry the skin. ?Keep baths or showers short (less than 5 minutes) in warm water. Do not use hot water. ?Use mild, unscented cleansers for bathing. Avoid soap and bubble bath. ?Apply a moisturizer Stahlman your skin right after a bath or shower. ?Do not apply anything Schuermann your skin without checking with your health care provider. ?General instructions ?Take or apply over-the-counter and prescription medicines only as told by your health care provider. ?Dress in clothes made of cotton or cotton blends. Dress lightly because heat increases itchiness. ?When washing your clothes, rinse your clothes twice so all of the soap is removed. ?Avoid any triggers that can cause a flare-up. ?Keep your fingernails cut short. ?Avoid scratching. Scratching makes the rash and itchiness worse. A break in the skin from scratching could result in a skin infection (impetigo). ?Do not be around people who have cold sores or fever blisters. If you get the infection, it may cause your atopic dermatitis Bomberger worsen. ?Keep all follow-up visits. This is important. ?Contact a health care provider if: ?Your itchiness interferes with sleep. ?Your rash gets worse or is not better within one week of starting treatment. ?You have a fever. ?You have a rash flare-up after having contact with someone who has cold sores or fever blisters. ?Get help right away if: ?You develop pus or soft yellow scabs in the rash   area. ?Summary ?Atopic dermatitis causes a red rash and itchy, dry, scaly skin. ?Treatment focuses on controlling the itchiness and scratching, limiting exposure Quinby things that you are sensitive or allergic Messamore (allergens), recognizing  situations that cause stress, and developing a plan Lizardi manage stress. ?Keep your skin well moisturized. ?Keep baths or showers shorter than 5 minutes and use warm water. Do not use hot water. ?This information is not intended Knab replace advice given Leidner you by your health care provider. Make sure you discuss any questions you have with your health care provider. ?Document Revised: 11/06/2019 Document Reviewed: 11/06/2019 ?Elsevier Patient Education ? 2023 Elsevier Inc. ? ?

## 2022-04-25 IMAGING — DX DG CHEST 2V
2 series · 2 of 2 positions shown · non-contrast
Comparison: June 15, 2019.

CLINICAL DATA: Chest pain.

EXAM:
CHEST - 2 VIEW

[chest pa]
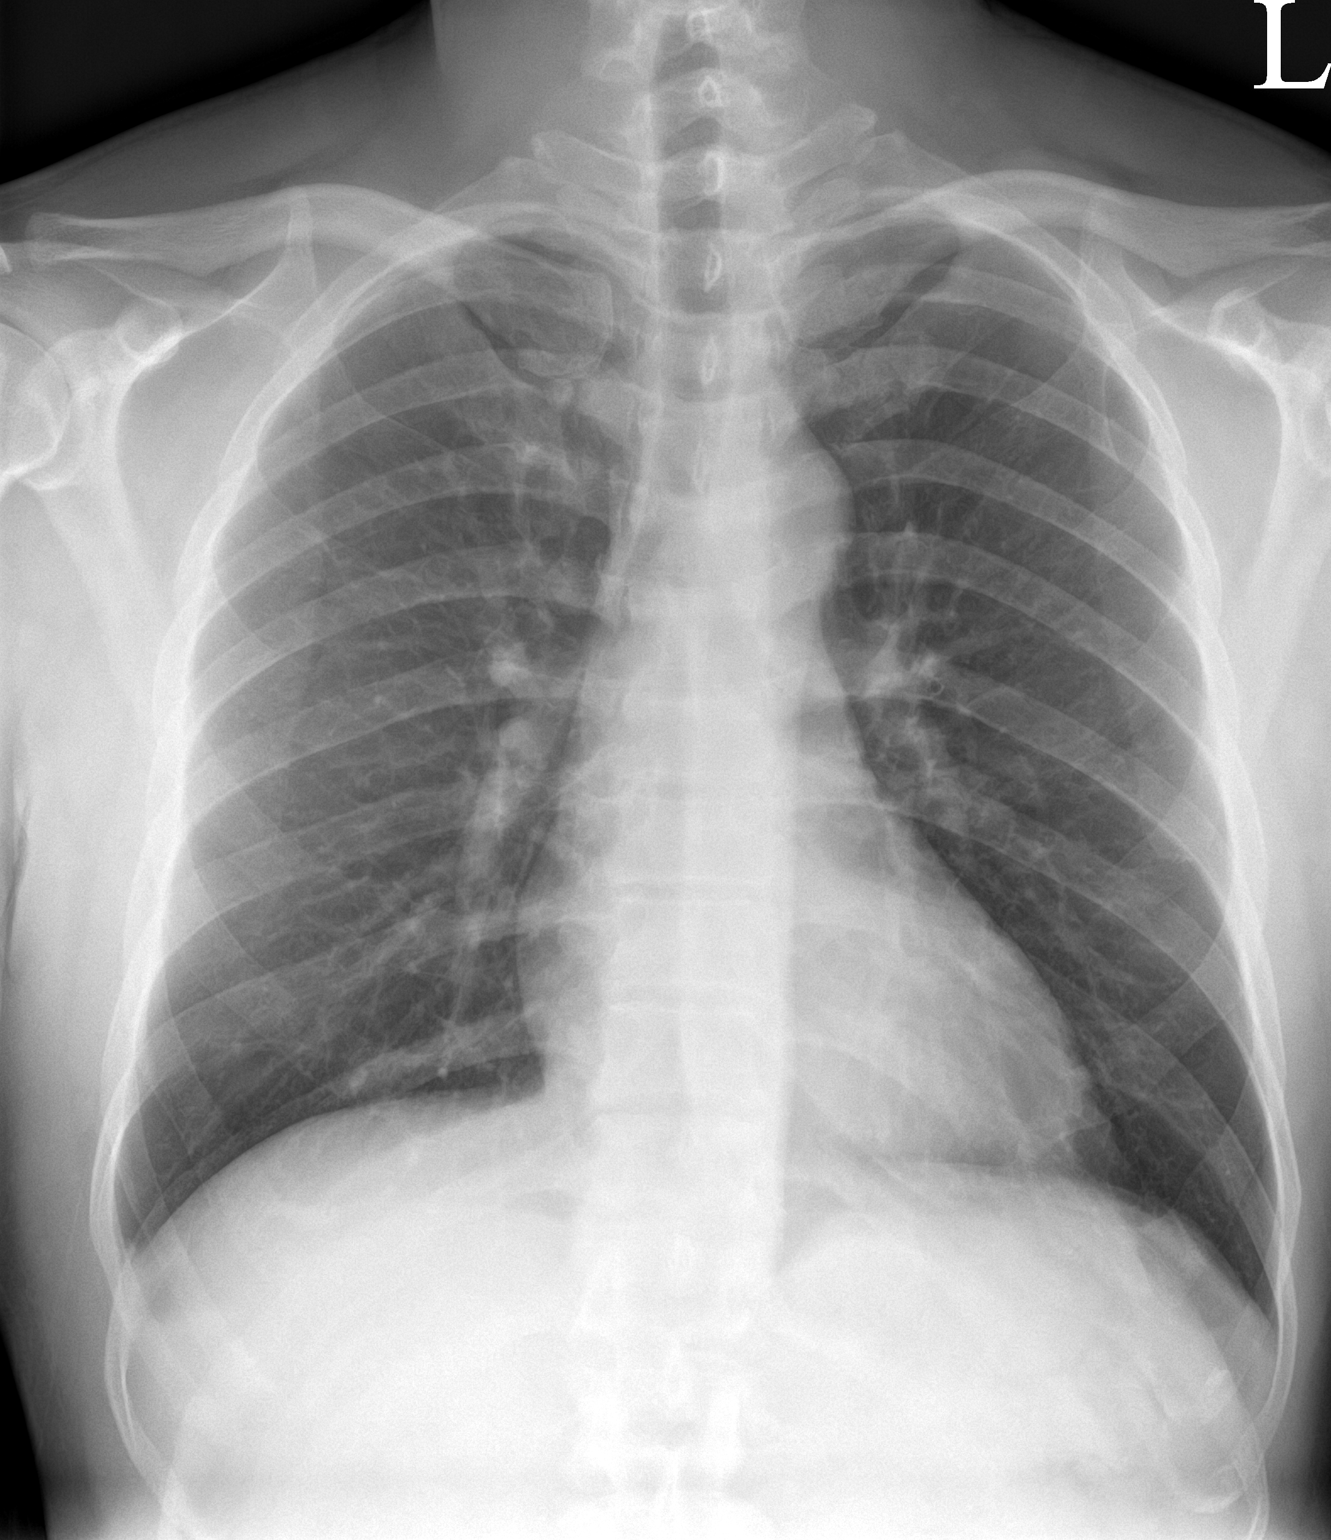

[chest lat]
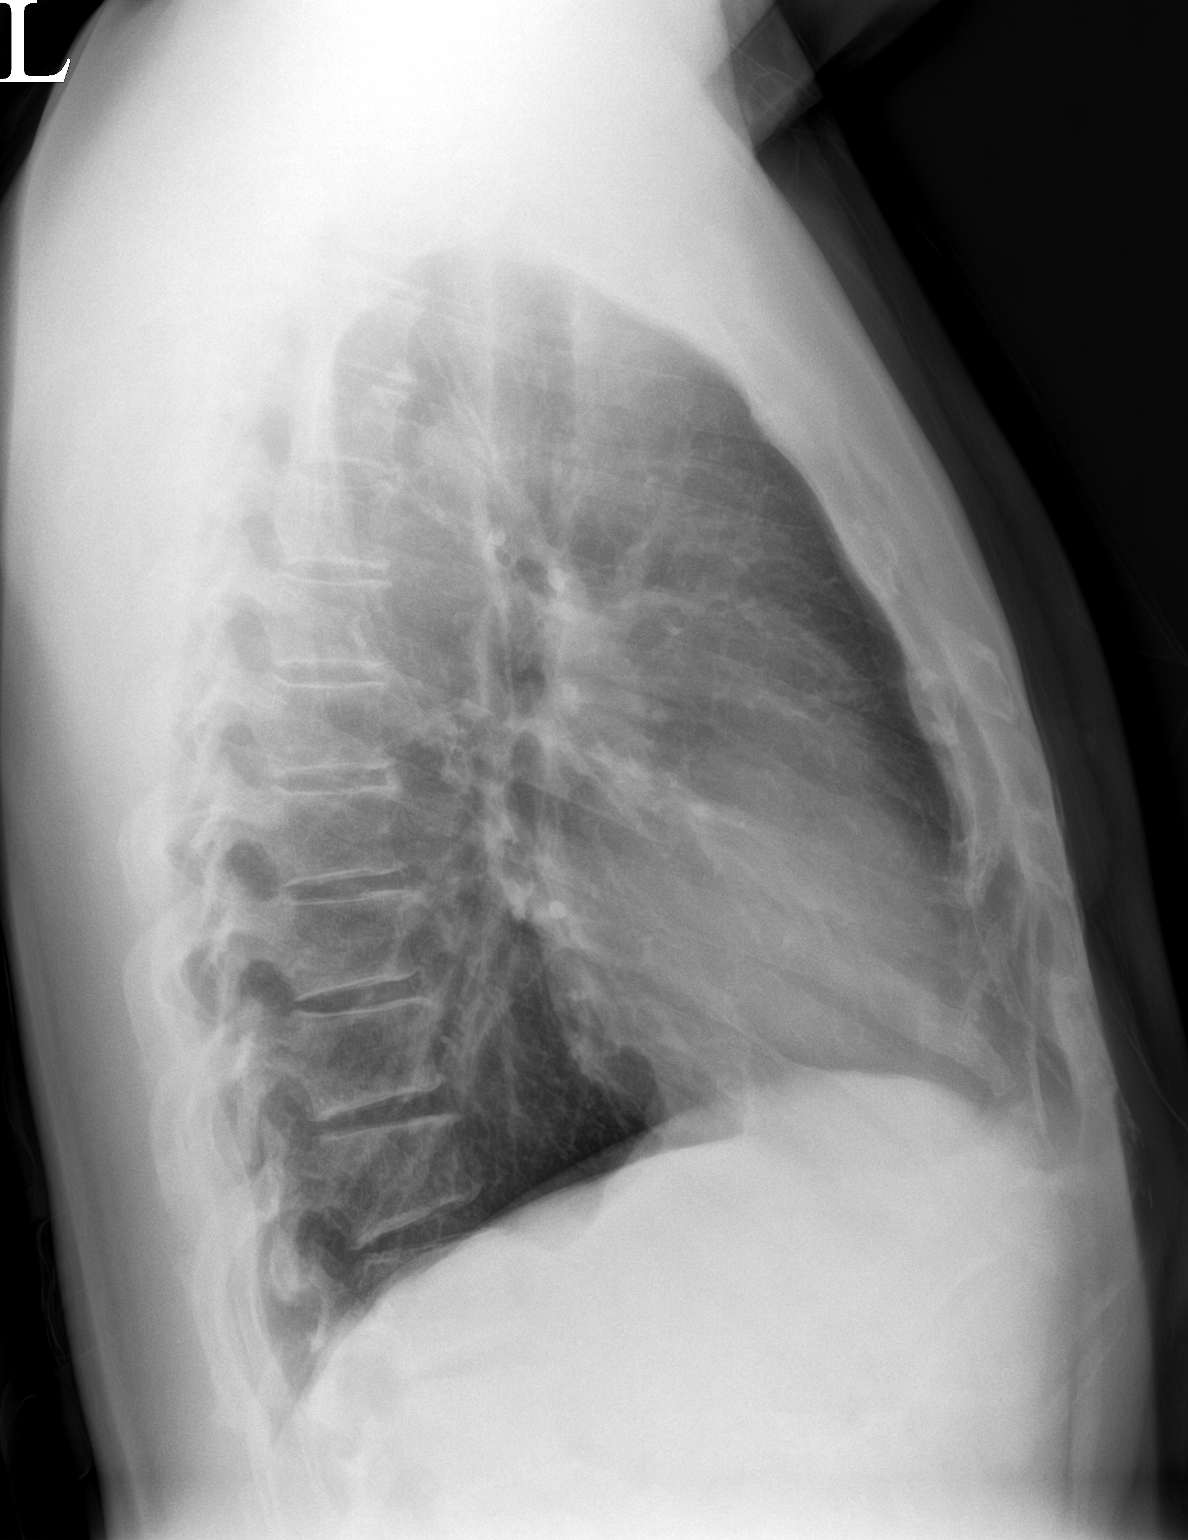

[2 of 2 positions shown; findings below may reference images not displayed]

FINDINGS: The heart size and mediastinal contours are within normal limits.
Both lungs are clear. The visualized skeletal structures are
unremarkable.
IMPRESSION: No active cardiopulmonary disease.

## 2024-04-13 ENCOUNTER — Ambulatory Visit: Admitting: Internal Medicine
